# Patient Record
Sex: Female | Born: 2005 | Race: White | Hispanic: No | Marital: Single | Smoking: Never smoker
Health system: Southern US, Community
[De-identification: ages and names within clinical notes are randomized; demographics above are authoritative.]

---
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
## 2024-01-29 DIAGNOSIS — K130 Diseases of lips: Secondary | ICD-10-CM | POA: Diagnosis not present

## 2024-01-29 DIAGNOSIS — L853 Xerosis cutis: Secondary | ICD-10-CM | POA: Diagnosis not present

## 2024-01-29 DIAGNOSIS — L700 Acne vulgaris: Secondary | ICD-10-CM | POA: Diagnosis not present

## 2024-02-23 DIAGNOSIS — L853 Xerosis cutis: Secondary | ICD-10-CM | POA: Diagnosis not present

## 2024-02-23 DIAGNOSIS — K130 Diseases of lips: Secondary | ICD-10-CM | POA: Diagnosis not present

## 2024-02-23 DIAGNOSIS — L700 Acne vulgaris: Secondary | ICD-10-CM | POA: Diagnosis not present
